# Patient Record
Sex: Female | Born: 2000 | Race: Black or African American | Hispanic: No | Marital: Single | Smoking: Never smoker
Health system: Southern US, Community
[De-identification: ages and names within clinical notes are randomized; demographics above are authoritative.]

---
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
## 2019-03-21 IMAGING — DX DG CHEST 2V
2 series · 2 of 2 positions shown · non-contrast
Comparison: None

CLINICAL DATA: Central chest pain and shortness of breath

EXAM:
CHEST - 2 VIEW

[w chest pa]
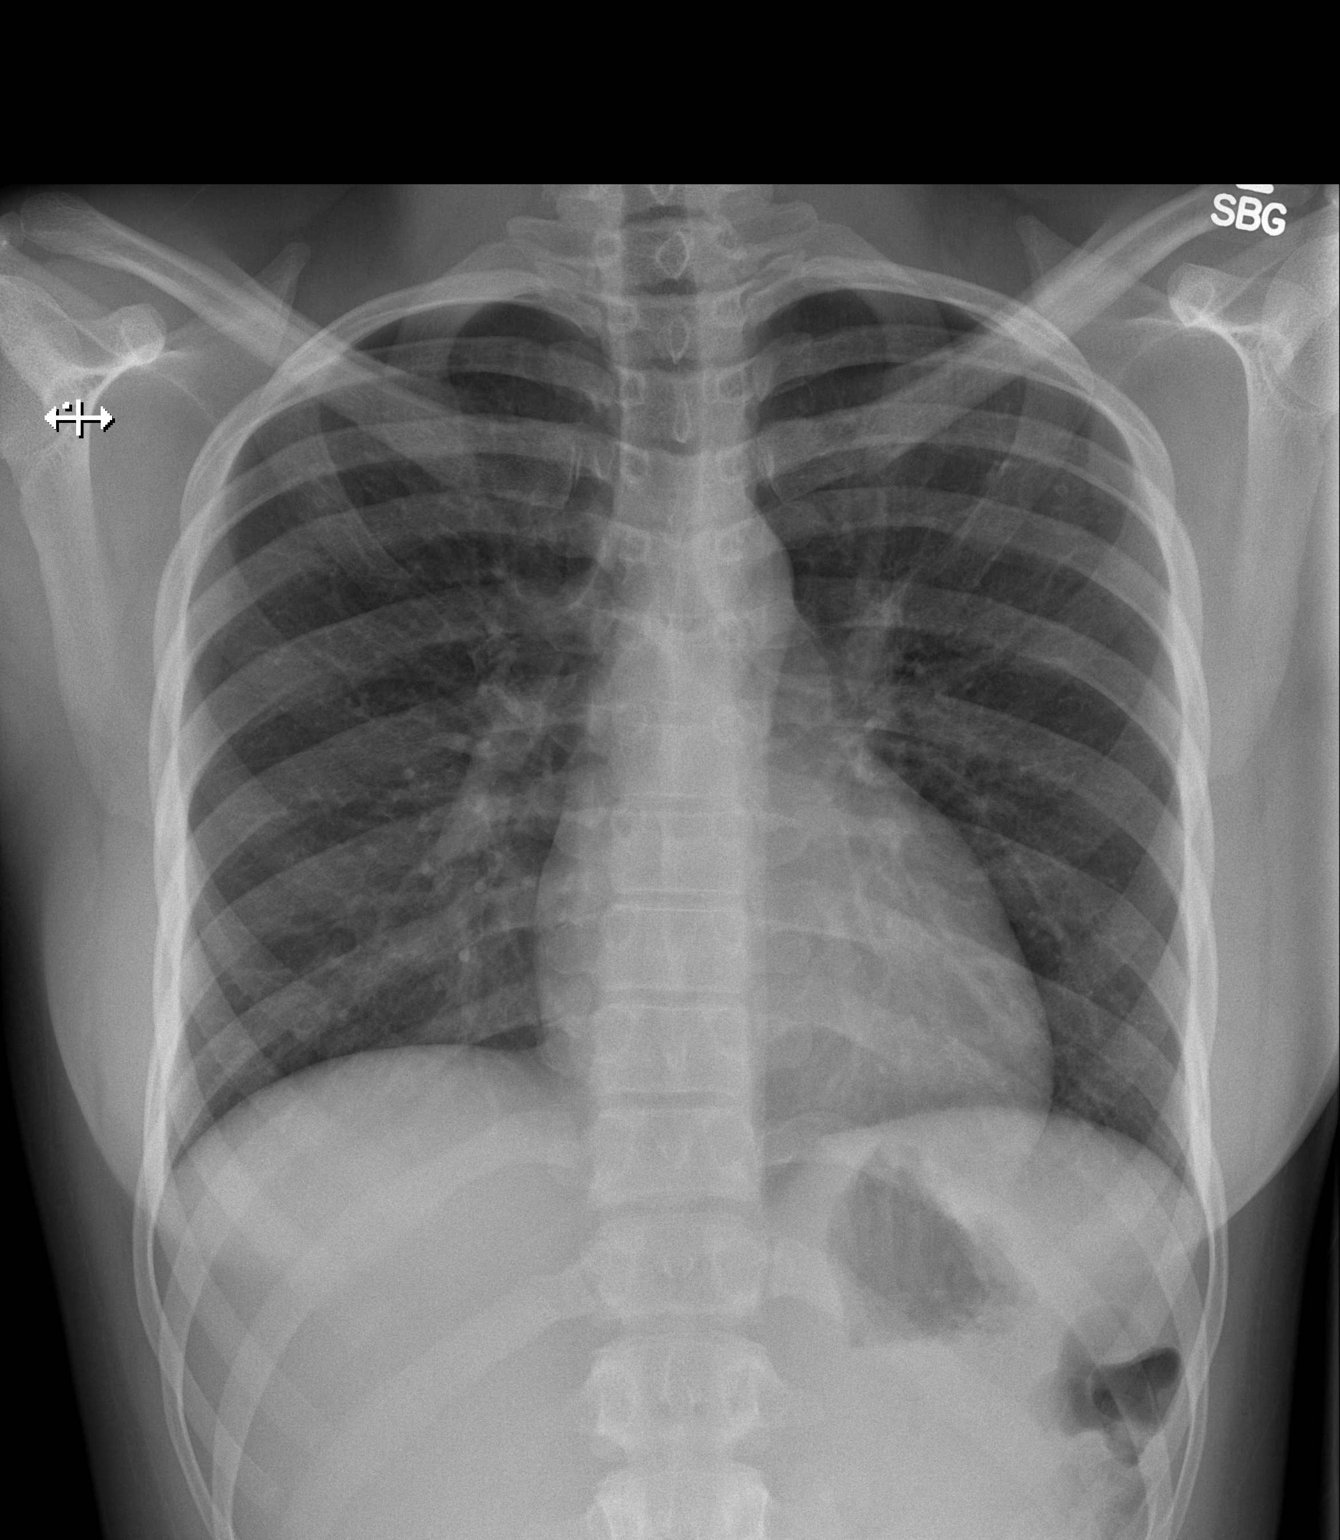

[w chest lat]
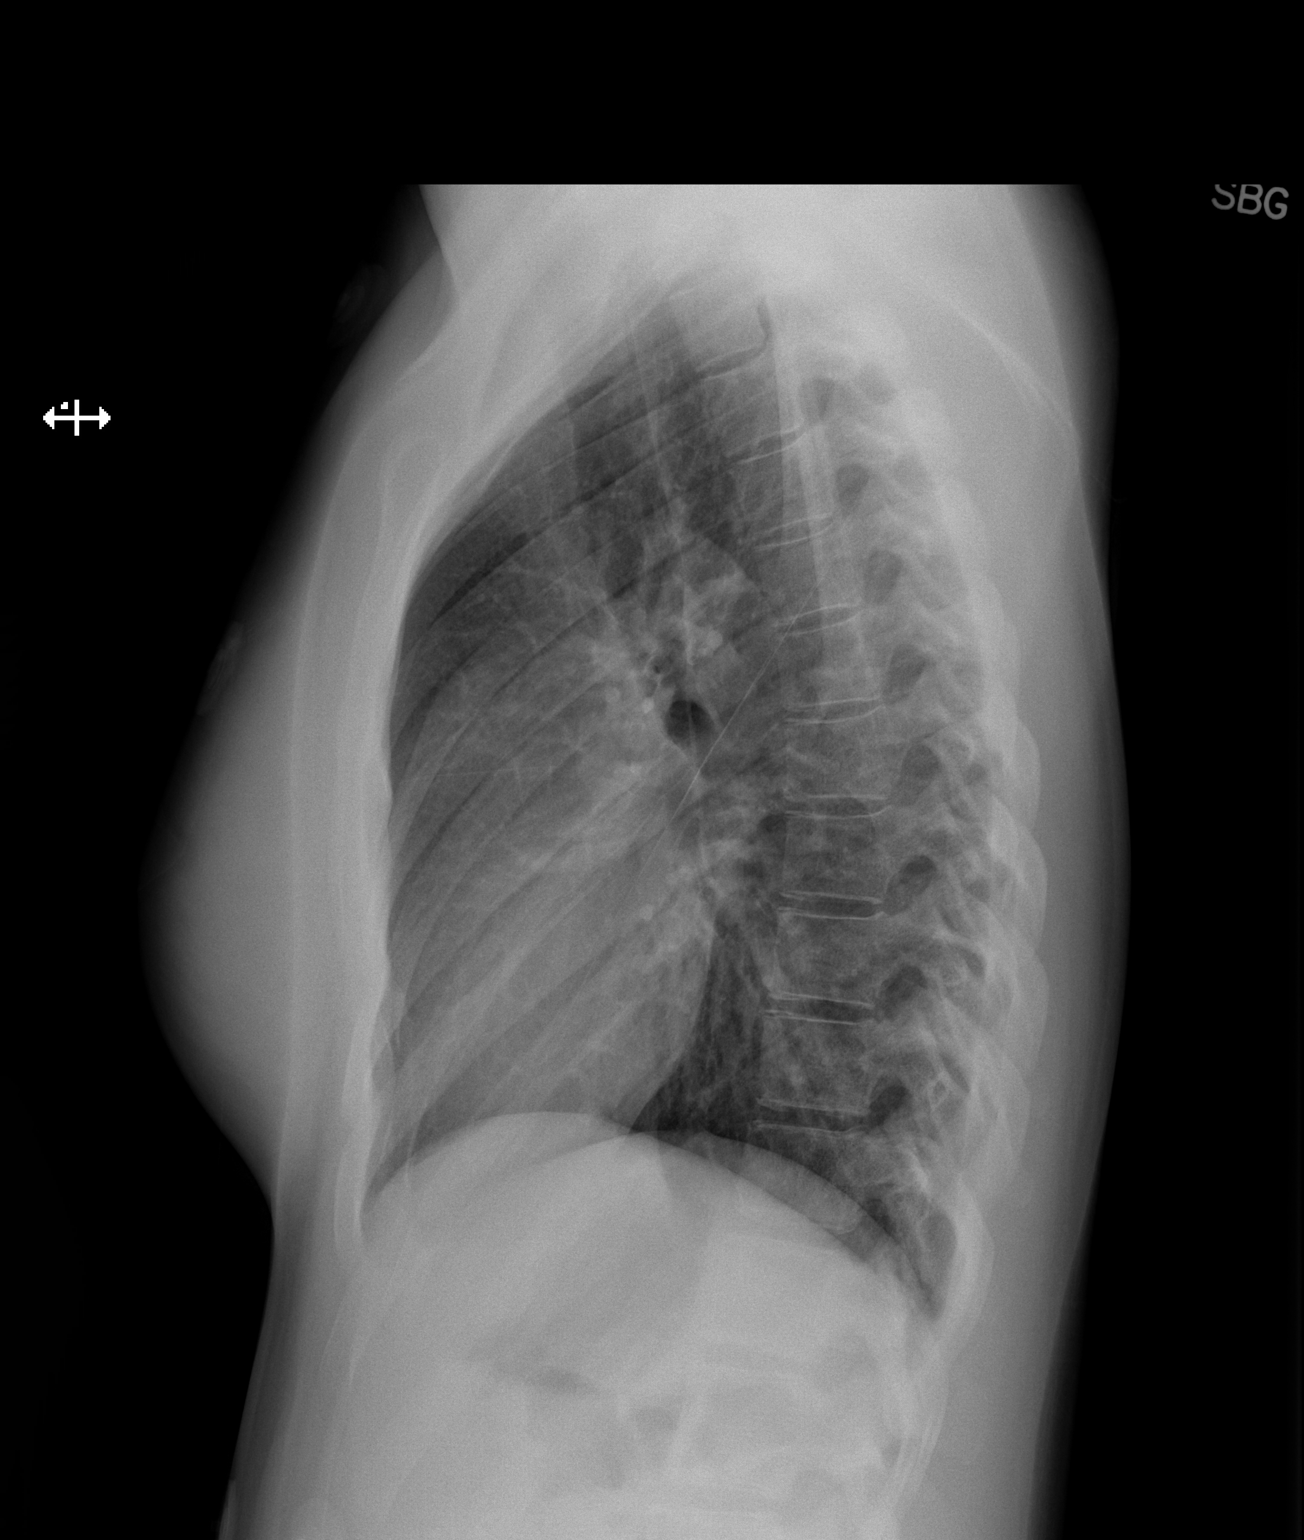

[2 of 2 positions shown; findings below may reference images not displayed]

FINDINGS: The heart size and mediastinal contours are within normal limits.
Both lungs are clear. The visualized skeletal structures are
unremarkable.
IMPRESSION: No active cardiopulmonary disease.

## 2020-03-19 ENCOUNTER — Other Ambulatory Visit: Payer: Self-pay

## 2020-03-19 ENCOUNTER — Encounter: Payer: Self-pay | Admitting: Obstetrics and Gynecology

## 2020-03-19 ENCOUNTER — Ambulatory Visit (INDEPENDENT_AMBULATORY_CARE_PROVIDER_SITE_OTHER): Payer: 59 | Admitting: Obstetrics and Gynecology

## 2020-03-19 VITALS — BP 118/76 | Ht 65.0 in | Wt 125.0 lb

## 2020-03-19 DIAGNOSIS — N760 Acute vaginitis: Secondary | ICD-10-CM

## 2020-03-19 DIAGNOSIS — N898 Other specified noninflammatory disorders of vagina: Secondary | ICD-10-CM

## 2020-03-19 DIAGNOSIS — Z113 Encounter for screening for infections with a predominantly sexual mode of transmission: Secondary | ICD-10-CM

## 2020-03-19 DIAGNOSIS — B9689 Other specified bacterial agents as the cause of diseases classified elsewhere: Secondary | ICD-10-CM

## 2020-03-19 DIAGNOSIS — Z01419 Encounter for gynecological examination (general) (routine) without abnormal findings: Secondary | ICD-10-CM | POA: Diagnosis not present

## 2020-03-19 LAB — WET PREP FOR TRICH, YEAST, CLUE

## 2020-03-19 MED ORDER — METRONIDAZOLE 500 MG PO TABS: 500.0000 mg | ORAL_TABLET | Freq: Two times a day (BID) | ORAL | 0 refills | Status: AC

## 2020-03-19 NOTE — Progress Notes (Signed)
CZARINA GINGRAS 04-26-2001 [id number]  SUBJECTIVE:  19 y.o. G0P0000 female new patient presents for routine gynecologic exam.  She also has a concern of vaginal odor.  No vaginal discharge or irritation.  She has been with the same sexual partner since January.  History of heavy periods which has been controlled with use of hormonal contraception.  She initially was on birth control pill trialing a few different kinds and then switched to the patch which has been working fine for her.  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  Current Outpatient Medications  Medication Sig Dispense Refill  . MELATONIN PO Take by mouth.    . Multiple Vitamin (MULTIVITAMIN) tablet Take 1 tablet by mouth daily.    . norelgestromin-ethinyl estradiol (ORTHO EVRA) 150-35 MCG/24HR transdermal patch Place 1 patch onto the skin once a week.     No current facility-administered medications for this visit.   Allergies: Patient has no known allergies.  Patient's last menstrual period was 02/25/2020.  Past medical history,surgical history, problem list, medications, allergies, family history and social history were all reviewed and documented as reviewed in the EPIC chart.  ROS:  Feeling well. No dyspnea or chest pain on exertion.  No abdominal pain, change in bowel habits, black or bloody stools.  No urinary tract symptoms. GYN ROS: normal menses, no abnormal bleeding, pelvic pain or discharge, no breast pain or new or enlarging lumps on self exam. No neurological complaints.   OBJECTIVE:  BP 118/76   Ht 5\' 5"  (1.651 m)   Wt 125 lb (56.7 kg)   LMP 02/25/2020   BMI 20.80 kg/m  The patient appears well, alert, oriented x 3, in no distress. ENT normal.  Neck supple. No cervical or supraclavicular adenopathy or thyromegaly.  Lungs are clear, good air entry, no wheezes, rhonchi or rales. S1 and S2 normal, no murmurs, regular rate and rhythm.  Abdomen soft without tenderness,  guarding, mass or organomegaly.  Neurological is normal, no focal findings.  BREAST EXAM: breasts appear normal, no suspicious masses, no skin or nipple changes or axillary nodes  PELVIC EXAM: VULVA: normal appearing vulva with no masses, tenderness or lesions, VAGINA: normal appearing vagina with normal color and discharge, no lesions, CERVIX: normal appearing cervix with ectropion, without discharge or lesions, UTERUS: uterus is normal size, shape, consistency and nontender, ADNEXA: normal adnexa in size, nontender and no masses Vaginal wet prep indicates positive clue cells, no trichomonas, no hyphae.  Few WBC, numerous bacteria, 13-20 epithelial cells/hpf.  Chaperone: Caryn Bee present during the examination  ASSESSMENT:  19 y.o. G0P0000 here for annual gynecologic exam  PLAN:   1. No hormonal or menstrual concerns.  2. Pap smear recommended at age 62. 34. Contraception.  Remains on birth control patch with good results.  She says she does not need a refill at this time and she has some from another prescriber.  Will notify office if she needs any refills.  Safe sex practices encouraged.   4.  Vaginal odor.  GC/chlamydia screen is performed at patient request.  Vaginal wet prep indicates bacterial vaginosis.  We discussed bacterial vaginosis, symptoms, imbalance of vaginal organisms, treatment with metronidazole 500 mg twice daily by mouth for 7 days.  She does not consume alcohol but is cautioned against use while taking this antibiotic. 5. Breast exam normal.  Self breast awareness is encouraged. 6. Indicates she has previously had the HPV vaccination. 7. Health maintenance.  No signs or  symptoms to warrant labs screening at this point.  Return annually or sooner, prn.  Theresia Majors MD 03/19/20

## 2020-03-20 LAB — C. TRACHOMATIS/N. GONORRHOEAE RNA
C. trachomatis RNA, TMA: NOT DETECTED
N. gonorrhoeae RNA, TMA: NOT DETECTED

## 2020-04-10 ENCOUNTER — Encounter: Payer: Self-pay | Admitting: Obstetrics and Gynecology

## 2020-04-10 ENCOUNTER — Other Ambulatory Visit: Payer: Self-pay

## 2020-04-10 ENCOUNTER — Ambulatory Visit (INDEPENDENT_AMBULATORY_CARE_PROVIDER_SITE_OTHER): Payer: 59 | Admitting: Obstetrics and Gynecology

## 2020-04-10 VITALS — BP 110/70

## 2020-04-10 DIAGNOSIS — B9689 Other specified bacterial agents as the cause of diseases classified elsewhere: Secondary | ICD-10-CM | POA: Diagnosis not present

## 2020-04-10 DIAGNOSIS — N898 Other specified noninflammatory disorders of vagina: Secondary | ICD-10-CM

## 2020-04-10 DIAGNOSIS — N760 Acute vaginitis: Secondary | ICD-10-CM

## 2020-04-10 LAB — WET PREP FOR TRICH, YEAST, CLUE

## 2020-04-10 MED ORDER — METRONIDAZOLE 0.75 % VA GEL: 1.0000 | Freq: Every day | VAGINAL | 0 refills | Status: AC

## 2020-04-10 NOTE — Progress Notes (Signed)
   Kristina Perry Dec 03, 2000 [id number]  SUBJECTIVE:  19 y.o. G0P0000 female presents for vaginal itching.  She was seen for her routine visit 3 weeks ago 03/19/2020 and found to have bacterial vaginosis treated with Flagyl for 7 days which she did complete.  She initially felt better but 5 days ago she started to develop vaginal itching symptoms with some white chunky discharge.  She took a leftover fluconazole x1 and felt better but then the symptoms recurred again so she presents today for evaluation.   Current Outpatient Medications  Medication Sig Dispense Refill  . MELATONIN PO Take by mouth.    . Multiple Vitamin (MULTIVITAMIN) tablet Take 1 tablet by mouth daily.    . norelgestromin-ethinyl estradiol (ORTHO EVRA) 150-35 MCG/24HR transdermal patch Place 1 patch onto the skin once a week.     No current facility-administered medications for this visit.   Allergies: Patient has no known allergies.  Patient's last menstrual period was 03/18/2020.  Past medical history,surgical history, problem list, medications, allergies, family history and social history were all reviewed and documented as reviewed in the EPIC chart.  ROS:  Feeling well. No dyspnea or chest pain on exertion.  No abdominal pain, change in bowel habits, black or bloody stools.  No urinary tract symptoms. GYN ROS: normal menses, no abnormal bleeding, pelvic pain or discharge   OBJECTIVE:  BP 110/70   LMP 03/18/2020  PELVIC EXAM: VULVA: normal appearing vulva with no masses, tenderness or lesions, VAGINA: normal appearing vagina with normal color, normal amount of thick white discharge without odor noted, CERVIX: normal appearing cervix without discharge or lesions, WET MOUNT done - results: +clue cells, no hyphae or trichomonas, few WBC, many bacteria, 13-20 squamous epithelial cells/hpf Urinalysis 0-5 WBC, no RBC, 10-20 squamous epithelial cells/hpf, moderate bacteria, negative nitrite, negative leukocyte  estrace Chaperone: Kennon Portela present during the examination   ASSESSMENT:  19 y.o. G0P0000 here with bacterial vaginosis recurrence  PLAN:  Since she just completed treatment with oral metronidazole, this time we will try MetroGel x5 nights intravaginal application instead.  CDC guidelines do not recommend treatment of sexual partners for presumed BV as clinical trial data do not indicate that likelihood of BV recurrence is affected by partner treatment. That is something he could potentially look into separately with his healthcare provider.   Theresia Majors MD 04/10/20

## 2020-04-12 LAB — URINE CULTURE
MICRO NUMBER:: 10714914
SPECIMEN QUALITY:: ADEQUATE

## 2020-04-12 LAB — URINALYSIS, COMPLETE W/RFL CULTURE
Bilirubin Urine: NEGATIVE
Glucose, UA: NEGATIVE
Hgb urine dipstick: NEGATIVE
Hyaline Cast: NONE SEEN /LPF
Ketones, ur: NEGATIVE
Leukocyte Esterase: NEGATIVE
Nitrites, Initial: NEGATIVE
Protein, ur: NEGATIVE
RBC / HPF: NONE SEEN /HPF (ref 0–2)
Specific Gravity, Urine: 1.025 (ref 1.001–1.03)
pH: 7 (ref 5.0–8.0)

## 2020-04-12 LAB — CULTURE INDICATED
